# Patient Record
Sex: Male | Born: 1985 | Race: Black or African American | Hispanic: No | Marital: Single | Smoking: Never smoker
Health system: Southern US, Community
[De-identification: ages and names within clinical notes are randomized; demographics above are authoritative.]

## PROBLEM LIST (undated history)

## (undated) HISTORY — PX: HAND SURGERY: SHX662

---

## 2007-11-07 ENCOUNTER — Emergency Department (HOSPITAL_COMMUNITY): Admission: EM | Admit: 2007-11-07 | Discharge: 2007-11-07 | Payer: Self-pay | Admitting: Emergency Medicine

## 2008-03-05 ENCOUNTER — Emergency Department (HOSPITAL_COMMUNITY): Admission: EM | Admit: 2008-03-05 | Discharge: 2008-03-06 | Payer: Self-pay | Admitting: Emergency Medicine

## 2008-09-12 ENCOUNTER — Emergency Department (HOSPITAL_COMMUNITY): Admission: EM | Admit: 2008-09-12 | Discharge: 2008-09-12 | Payer: Self-pay | Admitting: Emergency Medicine

## 2009-07-26 ENCOUNTER — Emergency Department (HOSPITAL_COMMUNITY): Admission: EM | Admit: 2009-07-26 | Discharge: 2009-07-26 | Payer: Self-pay | Admitting: Emergency Medicine

## 2010-07-30 ENCOUNTER — Emergency Department (HOSPITAL_COMMUNITY)
Admission: EM | Admit: 2010-07-30 | Discharge: 2010-07-30 | Disposition: A | Discharge status: Home / Self Care | Payer: BC Managed Care – PPO | Attending: Emergency Medicine | Admitting: Emergency Medicine

## 2010-07-30 DIAGNOSIS — R21 Rash and other nonspecific skin eruption: Secondary | ICD-10-CM | POA: Insufficient documentation

## 2013-02-23 ENCOUNTER — Emergency Department (HOSPITAL_COMMUNITY): Payer: BC Managed Care – PPO

## 2013-02-23 ENCOUNTER — Encounter (HOSPITAL_COMMUNITY): Payer: Self-pay | Admitting: Emergency Medicine

## 2013-02-23 ENCOUNTER — Emergency Department (HOSPITAL_COMMUNITY)
Admission: EM | Admit: 2013-02-23 | Discharge: 2013-02-23 | Disposition: A | Discharge status: Home / Self Care | Payer: BC Managed Care – PPO | Attending: Emergency Medicine | Admitting: Emergency Medicine

## 2013-02-23 DIAGNOSIS — B9789 Other viral agents as the cause of diseases classified elsewhere: Secondary | ICD-10-CM | POA: Insufficient documentation

## 2013-02-23 DIAGNOSIS — Z79899 Other long term (current) drug therapy: Secondary | ICD-10-CM | POA: Insufficient documentation

## 2013-02-23 DIAGNOSIS — B349 Viral infection, unspecified: Secondary | ICD-10-CM

## 2013-02-23 MED ORDER — ACETAMINOPHEN 325 MG PO TABS
650.0000 mg | ORAL_TABLET | Freq: Once | ORAL | Status: AC
  Administered 2013-02-23: 650 mg via ORAL
  Filled 2013-02-23: qty 2

## 2013-02-23 NOTE — ED Provider Notes (Signed)
CSN: [id number]     Arrival date & time 02/23/13  1121 History   First MD Initiated Contact with Patient 02/23/13 1127     Chief Complaint  Patient presents with  . Sore Throat  . Chills   (Consider location/radiation/quality/duration/timing/severity/associated sxs/prior Treatment) The history is provided by the patient. No language interpreter was used.  Jacob Robertson is a 27 y/o M presenting to the ED with cough, nasal congestion, sore throat, generalized bodyaches, headache that started yesterday. Patient reported that his sore throat is a soreness sensation that worsens with swallowing and coughing. Patient reported that he has been coughing up some phlegm, described the phlegm as a yellowish discoloration. Patient reported that he has aches all over his body, reported intermittent chills over the night last night. Stated that his symptoms started at approximately 11:30 PM last night. Reported that he has been using Tylenol cold and flu with minimal relief. Denied flu vaccination. Patient works for [employer] in [place]. Denied visual distortions, nausea, vomiting, diarrhea, abdominal pain, rash, recent travels outside the [place], chest pain, shortness of breath, difficulty breathing, ear pain, neck pain, neck stiffness, difficulty swallowing. PCP [facility] in [place]  History reviewed. No pertinent past medical history. History reviewed. No pertinent past surgical history. History reviewed. No pertinent family history. History  Substance Use Topics  . Smoking status: Never Smoker   . Smokeless tobacco: Not on file  . Alcohol Use: Not on file    Review of Systems  Constitutional: Positive for fever (Low-grade) and chills.  HENT: Positive for congestion, rhinorrhea and sore throat. Negative for ear pain and trouble swallowing.   Eyes: Negative for visual disturbance.  Respiratory: Positive for cough. Negative for chest tightness and shortness of breath.    Cardiovascular: Negative for chest pain.  Gastrointestinal: Negative for nausea, vomiting, abdominal pain and diarrhea.  Musculoskeletal: Positive for myalgias. Negative for neck pain and neck stiffness.  Neurological: Positive for headaches. Negative for weakness and numbness.  All other systems reviewed and are negative.    Allergies  Review of patient's allergies indicates no known allergies.  Home Medications   Current Outpatient Rx  Name  Route  Sig  Dispense  Refill  . cyclobenzaprine (FLEXERIL) 10 MG tablet   Oral   Take 10 mg by mouth 3 (three) times daily as needed for muscle spasms.         . Phenylephrine-DM-GG-APAP (TYLENOL COLD/FLU SEVERE) 5-10-200-325 MG TABS   Oral   Take 1 tablet by mouth every 6 (six) hours as needed (cold).          BP 142/75  Pulse 87  Temp(Src) 99.6 F (37.6 C)  Resp 18  SpO2 100% Physical Exam  Nursing note and vitals reviewed. Constitutional: He is oriented to person, place, and time. He appears well-developed and well-nourished. No distress.  HENT:  Head: Normocephalic and atraumatic.  Mouth/Throat: Oropharynx is clear and moist. No oropharyngeal exudate.  Negative posterior oropharynx swelling. Mild erythema noted to the oropharynx posteriorly. Uvula midline, symmetrical elevation. Negative tonsillar exudate or swelling identified bilaterally. Negative trismus. Negative signs of peritonsillar abscess. Negative oral lesions  Eyes: Conjunctivae and EOM are normal. Pupils are equal, round, and reactive to light. Right eye exhibits no discharge. Left eye exhibits no discharge.  Neck: Normal range of motion. Neck supple.  Negative neck stiffness Negative nuchal rigidity Negative cervical lymphadenopathy Negative meningeal signs  Cardiovascular: Normal rate and regular rhythm.  Exam reveals no friction rub.  No murmur heard. Pulses:      Radial pulses are 2+ on the right side, and 2+ on the left side.       Dorsalis pedis pulses  are 2+ on the right side, and 2+ on the left side.  Pulmonary/Chest: Effort normal and breath sounds normal. No respiratory distress. He has no wheezes. He has no rales.  Musculoskeletal: Normal range of motion.  Lymphadenopathy:    He has no cervical adenopathy.  Neurological: He is alert and oriented to person, place, and time. He exhibits normal muscle tone. Coordination normal.  Skin: Skin is warm and dry. He is not diaphoretic. No erythema.  Psychiatric: He has a normal mood and affect. His behavior is normal. Thought content normal.    ED Course  Procedures (including critical care time) Labs Review Labs Reviewed  RAPID STREP SCREEN  CULTURE, GROUP A STREP   Imaging Review Dg Chest 2 View  02/23/2013   CLINICAL DATA:  Cough and fever  EXAM: CHEST  2 VIEW  COMPARISON:  None.  FINDINGS: The lungs are clear. Heart size and pulmonary vascularity are normal. No adenopathy. No bone lesions.  IMPRESSION: No abnormality noted.   Electronically Signed   By: Bretta Bang M.D.   On: 02/23/2013 12:18    EKG Interpretation   None       MDM   1. Viral illness     Patient presenting to emergency department with cough, congestion, headache, myalgias generalized that started yesterday at approximately 11:30 AM. Patient works at [employer] and a [employer]. Alert and oriented. Negative neck stiffness, negative meningeal signs. Lungs clear to auscultation bilaterally. Heart rate and rhythm normal. Full range of motion to upper and lower extremities bilaterally. Negative signs of rashes. Negative signs of oral lesions. Oral exam unremarkable -negative lesions noted. Chest x-ray negative for any acute abnormalities. Rapid strep test negative. Doubt pneumonia. Suspicion to be possible viral illness. Patient stable, mild low-grade fever identified in the ED with 99.38F-Tylenol given while in ED setting. Discharged patient. Discussed with patient viral illness and supportive therapy.  Discussed with patient to rest and stay hydrated. Recommended flu vaccine. Recommended Tylenol as needed for fever control. Discussed with patient to closely monitor symptoms and if symptoms are to worsen or change report back to emergency department - strict return instructions given. Patient agreed to plan of care, understood, all questions answered.    Raymon Mutton, PA-C 02/23/13 2351

## 2013-02-23 NOTE — ED Notes (Signed)
Pt c/o of sore throat, chills, fever x2 days. Denies n/v/d.

## 2013-02-24 NOTE — ED Provider Notes (Signed)
Medical screening examination/treatment/procedure(s) were performed by non-physician practitioner and as supervising physician I was immediately available for consultation/collaboration.  Parsa Rickett, MD 02/24/13 0651 

## 2013-02-25 LAB — CULTURE, GROUP A STREP

## 2015-10-14 ENCOUNTER — Ambulatory Visit (HOSPITAL_COMMUNITY)
Admission: EM | Admit: 2015-10-14 | Discharge: 2015-10-14 | Disposition: A | Discharge status: Home / Self Care | Payer: BLUE CROSS/BLUE SHIELD | Attending: Emergency Medicine | Admitting: Emergency Medicine

## 2015-10-14 ENCOUNTER — Encounter (HOSPITAL_COMMUNITY): Payer: Self-pay | Admitting: Emergency Medicine

## 2015-10-14 DIAGNOSIS — B350 Tinea barbae and tinea capitis: Secondary | ICD-10-CM | POA: Diagnosis not present

## 2015-10-14 MED ORDER — KETOCONAZOLE 2 % EX SHAM: 1.0000 "application " | MEDICATED_SHAMPOO | CUTANEOUS | Status: AC

## 2015-10-14 NOTE — ED Notes (Signed)
[facility] a [facility] of a rash on his head that he believed to be a ringworm. The patient stated that it has been there about 3 weeks.

## 2015-10-14 NOTE — ED Provider Notes (Signed)
CSN: [id number]     Arrival date & time 10/14/15  1744 History   First MD Initiated Contact with Patient 10/14/15 1831     Chief Complaint  Patient presents with  . Rash   (Consider location/radiation/quality/duration/timing/severity/associated sxs/prior Treatment) HPI Comments: 30 year old male noticed a small annular bulging spots to his scalp approximately 3 weeks ago. They have been occurring in various areas and sizes since then. Mildly pruritic. There are some smaller areas that seem to be healing in hair is growing back.   History reviewed. No pertinent past medical history. History reviewed. No pertinent past surgical history. Family History  Problem Relation Age of Onset  . Osteoarthritis Mother    Social History  Substance Use Topics  . Smoking status: Never Smoker   . Smokeless tobacco: None  . Alcohol Use: No    Review of Systems  Constitutional: Negative.   Skin: Positive for rash.       To scalp  All other systems reviewed and are negative.   Allergies  Review of patient's allergies indicates no known allergies.  Home Medications   Prior to Admission medications   Medication Sig Start Date End Date Taking? Authorizing Provider  cyclobenzaprine (FLEXERIL) 10 MG tablet Take 10 mg by mouth 3 (three) times daily as needed for muscle spasms.    Historical Provider, MD  ketoconazole (NIZORAL) 2 % shampoo Apply 1 application topically 2 (two) times a week. To scalp 10/14/15 10/21/15  Hayden Rasmussenavid Santos Hardwick, NP  Phenylephrine-DM-GG-APAP (TYLENOL COLD/FLU SEVERE) 5-10-200-325 MG TABS Take 1 tablet by mouth every 6 (six) hours as needed (cold).    Historical Provider, MD   Meds Ordered and Administered this Visit  Medications - No data to display  BP 122/72 mmHg  Pulse 82  Temp(Src) 98.4 F (36.9 C) (Oral)  Resp 16  SpO2 98% No data found.   Physical Exam  Constitutional: He is oriented to person, place, and time. He appears well-developed and well-nourished. No distress.   Eyes: EOM are normal.  Neck: Normal range of motion. Neck supple.  Cardiovascular: Normal rate.   Pulmonary/Chest: Effort normal. No respiratory distress.  Musculoskeletal: He exhibits no edema.  Neurological: He is alert and oriented to person, place, and time. He exhibits normal muscle tone.  Skin: Skin is warm and dry.  Annular areas of alopecia in various sizes to the scalp. Minor pruritus. Not much scaling. No signs of infection.  Psychiatric: He has a normal mood and affect.  Nursing note and vitals reviewed.   ED Course  Procedures (including critical care time)  Labs Review Labs Reviewed - No data to display  Imaging Review No results found.   Visual Acuity Review  Right Eye Distance:   Left Eye Distance:   Bilateral Distance:    Right Eye Near:   Left Eye Near:    Bilateral Near:         MDM   1. Tinea capitis    Meds ordered this encounter  Medications  . ketoconazole (NIZORAL) 2 % shampoo    Sig: Apply 1 application topically 2 (two) times a week. To scalp    Dispense:  120 mL    Refill:  0    Order Specific Question:  Supervising Provider    Answer:  Charm RingsHONIG, ERIN J [[id number]]   use the Nizoral shampoo as directed During the day use the Lamisil cream twice daily. Keep head dry.    Hayden Rasmussenavid Lota Leamer, NP 10/14/15 [phone]

## 2019-02-03 ENCOUNTER — Ambulatory Visit (INDEPENDENT_AMBULATORY_CARE_PROVIDER_SITE_OTHER): Payer: Worker's Compensation

## 2019-02-03 ENCOUNTER — Other Ambulatory Visit: Payer: Self-pay

## 2019-02-03 ENCOUNTER — Ambulatory Visit (HOSPITAL_COMMUNITY)
Admission: EM | Admit: 2019-02-03 | Discharge: 2019-02-03 | Disposition: A | Discharge status: Home / Self Care | Payer: Worker's Compensation | Attending: Urgent Care | Admitting: Urgent Care

## 2019-02-03 ENCOUNTER — Encounter (HOSPITAL_COMMUNITY): Payer: Self-pay

## 2019-02-03 ENCOUNTER — Ambulatory Visit (HOSPITAL_COMMUNITY): Payer: BC Managed Care – PPO

## 2019-02-03 DIAGNOSIS — M7989 Other specified soft tissue disorders: Secondary | ICD-10-CM

## 2019-02-03 DIAGNOSIS — M79642 Pain in left hand: Secondary | ICD-10-CM | POA: Diagnosis not present

## 2019-02-03 DIAGNOSIS — S62647A Nondisplaced fracture of proximal phalanx of left little finger, initial encounter for closed fracture: Secondary | ICD-10-CM | POA: Diagnosis not present

## 2019-02-03 DIAGNOSIS — S6992XA Unspecified injury of left wrist, hand and finger(s), initial encounter: Secondary | ICD-10-CM

## 2019-02-03 DIAGNOSIS — S61412A Laceration without foreign body of left hand, initial encounter: Secondary | ICD-10-CM | POA: Diagnosis not present

## 2019-02-03 DIAGNOSIS — Z23 Encounter for immunization: Secondary | ICD-10-CM

## 2019-02-03 DIAGNOSIS — W268XXA Contact with other sharp object(s), not elsewhere classified, initial encounter: Secondary | ICD-10-CM

## 2019-02-03 MED ORDER — TETANUS-DIPHTH-ACELL PERTUSSIS 5-2.5-18.5 LF-MCG/0.5 IM SUSP
0.5000 mL | Freq: Once | INTRAMUSCULAR | Status: AC
  Administered 2019-02-03: 0.5 mL via INTRAMUSCULAR

## 2019-02-03 MED ORDER — LIDOCAINE-EPINEPHRINE (PF) 2 %-1:200000 IJ SOLN
INTRAMUSCULAR | Status: AC
  Filled 2019-02-03: qty 20

## 2019-02-03 MED ORDER — TETANUS-DIPHTH-ACELL PERTUSSIS 5-2.5-18.5 LF-MCG/0.5 IM SUSP
INTRAMUSCULAR | Status: AC
  Filled 2019-02-03: qty 0.5

## 2019-02-03 MED ORDER — NAPROXEN 500 MG PO TABS: 500.0000 mg | ORAL_TABLET | Freq: Two times a day (BID) | ORAL | 0 refills | Status: DC

## 2019-02-03 MED ORDER — HYDROCODONE-ACETAMINOPHEN 5-325 MG PO TABS: 2.0000 | ORAL_TABLET | ORAL | 0 refills | Status: DC | PRN

## 2019-02-03 MED ORDER — CEPHALEXIN 500 MG PO CAPS: 500.0000 mg | ORAL_CAPSULE | Freq: Three times a day (TID) | ORAL | 0 refills | Status: DC

## 2019-02-03 NOTE — ED Notes (Signed)
Ortho tech applied splint .    Patient had avs in treatment room-reviewed avs and work note with patient.   Escorted patient to the door.

## 2019-02-03 NOTE — Discharge Instructions (Addendum)
Please start take naproxen twice daily for your pain and inflammation.  If this does not help control your hand pain then adding hydrocodone.  Depending on how severe your pain is you can take 1 to 2 tablets every 4 hours as you needed but please do not take this medication if your pain is controlled with naproxen.  Contact one of the hand surgeons I provided for you on your visit summary first thing Monday morning.

## 2019-02-03 NOTE — ED Triage Notes (Signed)
Pt has a gash in his left hand. This injury took place at work last night.

## 2019-02-03 NOTE — Progress Notes (Signed)
Orthopedic Tech Progress Note Patient Details:  Jacob Robertson 06-29-1985 [id number] Patient had a bucky coban dressing on hand, removed an put a 2X2 and 2 pieces of tape to area on hand. (did not want ace wrap plus coban on patient may become to tight with time). Patient fingers were extremely sore so I applied splint on while fingers were slightly bent. Ortho Devices Type of Ortho Device: Ulna gutter splint Ortho Device/Splint Location: ULE Ortho Device/Splint Interventions: Adjustment, Application, Ordered   Post Interventions Patient Tolerated: Well Instructions Provided: Care of device, Adjustment of device   Janit Pagan 02/03/2019, 3:58 PM

## 2019-02-03 NOTE — ED Provider Notes (Signed)
MRN: [id number] DOB: 12-21-1985  Subjective:   Jacob Robertson is a 33 y.o. male presenting for worker's comp visit. Reports suffering a left hand injury last night while at work.  States that a metal piece crushed and impaled his left hand.  He suffered a laceration from that injury and immediately wash his wound.  He covered his hand with a thick dressing.  He presents to our urgent care today for evaluation.  Cannot recall his last Tdap.  Baran's medications list, allergies, past medical history and past surgical history were reviewed and excluded from this note due to being a worker's comp case.   Objective:   Vitals: BP 135/82 (BP Location: Left Arm)   Pulse 78   Temp 98.5 F (36.9 C) (Oral)   Resp 18   SpO2 100%   Physical Exam Constitutional:      Appearance: Normal appearance. He is well-developed and normal weight.  HENT:     Head: Normocephalic and atraumatic.     Right Ear: External ear normal.     Left Ear: External ear normal.     Nose: Nose normal.     Mouth/Throat:     Pharynx: Oropharynx is clear.  Eyes:     Extraocular Movements: Extraocular movements intact.     Pupils: Pupils are equal, round, and reactive to light.  Cardiovascular:     Rate and Rhythm: Normal rate.  Pulmonary:     Effort: Pulmonary effort is normal.  Musculoskeletal:     Left hand: He exhibits decreased range of motion (cannot flex at MCP, PIP of 4th, 5th fingers), tenderness, bony tenderness and swelling (over area circled). He exhibits normal capillary refill and no deformity. Lacerations: over palmar surface of his hand. Normal sensation noted. Decreased strength (secondary to pain) noted.       Hands:  Neurological:     Mental Status: He is alert and oriented to person, place, and time.  Psychiatric:        Mood and Affect: Mood normal.        Behavior: Behavior normal.     Dg Hand Complete Left  Result Date: 02/03/2019 CLINICAL DATA:  Per pt: works at [employer], got left hand cut in  between a metal beam last night. Exam is post suturing to the left hand, bandage in place. Laceration is mid to distal, AP 4th metacarpal. No prior injury to the left hand. Patient is no.*comment was truncated*left hand crush injury EXAM: LEFT HAND - COMPLETE 3+ VIEW COMPARISON:  None. FINDINGS: Horizontal fracture of the fifth digit, proximal phalanx. Transverse fracture spans the proximal metadiaphysis. Fracture does not enter the articular surface. Lateral projection demonstrates mild dorsal angulation of the distal fracture fragment IMPRESSION: Horizontal fracture through the shaft of the proximal phalanx fifth digit. Electronically Signed   By: Genevive Bi M.D.   On: 02/03/2019 15:22   PROCEDURE NOTE: laceration repair Verbal consent obtained from patient.  Local anesthesia with 3cc Lidocaine 2% with epinephrine.  Wound explored for tendon, ligament damage. Wound scrubbed with soap and water and rinsed. Wound closed with #3 4-0 Ethilon (2 simple interrupted, 1 horizontal mattress) sutures.  Wound cleansed and dressed.   Assessment and Plan :   1. Closed nondisplaced fracture of proximal phalanx of left little finger, initial encounter   2. Injury of left hand, initial encounter   3. Laceration of left hand without foreign body, initial encounter   4. Left hand pain   5. Swelling of left hand  Patient suffered a 5th proximal phalanx fracture, due to the laceration will consider open fracture.  Patient is to start Keflex, laceration successfully repaired.  Patient placed in ulnar gutter splint.  Will have him follow-up with hand surgeon ASAP.  Patient help from work until cleared from Copy.  He is to schedule naproxen for pain and inflammation, use hydrocodone for breakthrough pain.  [place] database reviewed and patient is low risk. Counseled patient on potential for adverse effects with medications prescribed/recommended today, ER and return-to-clinic precautions discussed, patient  verbalized understanding.    Jaynee Eagles, PA-C 02/03/19 1626

## 2019-03-24 ENCOUNTER — Other Ambulatory Visit: Payer: Self-pay | Admitting: *Deleted

## 2019-03-24 DIAGNOSIS — Z20828 Contact with and (suspected) exposure to other viral communicable diseases: Secondary | ICD-10-CM

## 2019-03-24 DIAGNOSIS — Z20822 Contact with and (suspected) exposure to covid-19: Secondary | ICD-10-CM

## 2019-03-27 LAB — NOVEL CORONAVIRUS, NAA: SARS-CoV-2, NAA: NOT DETECTED

## 2019-09-24 ENCOUNTER — Ambulatory Visit: Payer: BC Managed Care – PPO | Attending: Internal Medicine

## 2019-09-24 DIAGNOSIS — Z23 Encounter for immunization: Secondary | ICD-10-CM

## 2019-09-24 NOTE — Progress Notes (Signed)
   Covid-19 Vaccination Clinic  Name:  Semaje Kinker    MRN: [id number] DOB: 1985-07-26  09/24/2019  Mr. Trueheart was observed post Covid-19 immunization for 15 minutes without incident. He was provided with Vaccine Information Sheet and instruction to access the V-Safe system.   Mr. Seidman was instructed to call 911 with any severe reactions post vaccine: Marland Kitchen Difficulty breathing  . Swelling of face and throat  . A fast heartbeat  . A bad rash all over body  . Dizziness and weakness   Immunizations Administered    Name Date Dose VIS Date Route   Pfizer COVID-19 Vaccine 09/24/2019 12:01 PM 0.3 mL 07/04/2018 Intramuscular   Manufacturer: [employer], [employer]   Lot: [id number]   NDC: [address]-2

## 2019-10-15 ENCOUNTER — Ambulatory Visit: Payer: BC Managed Care – PPO | Attending: Internal Medicine

## 2019-10-15 DIAGNOSIS — Z23 Encounter for immunization: Secondary | ICD-10-CM

## 2019-10-15 NOTE — Progress Notes (Signed)
   Covid-19 Vaccination Clinic  Name:  Jacob Robertson    MRN: [id number] DOB: 24-Sep-1985  10/15/2019  [name] was observed post Covid-19 immunization for 15 minutes without incident. He was provided with Vaccine Information Sheet and instruction to access the V-Safe system.   [name] was instructed to call 911 with any severe reactions post vaccine: Marland Kitchen Difficulty breathing  . Swelling of face and throat  . A fast heartbeat  . A bad rash all over body  . Dizziness and weakness   Immunizations Administered    Name Date Dose VIS Date Route   Pfizer COVID-19 Vaccine 10/15/2019 12:42 PM 0.3 mL 07/04/2018 Intramuscular   Manufacturer: [employer], [employer]   Lot: [id number]   NDC: [address]-2

## 2020-11-21 ENCOUNTER — Ambulatory Visit: Payer: BC Managed Care – PPO | Attending: Internal Medicine

## 2020-11-21 DIAGNOSIS — Z20822 Contact with and (suspected) exposure to covid-19: Secondary | ICD-10-CM

## 2020-11-22 LAB — NOVEL CORONAVIRUS, NAA: SARS-CoV-2, NAA: DETECTED — AB

## 2020-11-22 LAB — SARS-COV-2, NAA 2 DAY TAT

## 2021-02-07 IMAGING — DX DG HAND COMPLETE 3+V*L*
3 series · 3 of 3 positions shown · non-contrast
Comparison: None.

CLINICAL DATA: Per pt: works at [employer], got left hand cut in between a
metal beam last night. Exam is post suturing to the left hand,
bandage in place. Laceration is mid to distal, AP 4th metacarpal. No
prior injury to the left hand. Patient is no.*comment was
truncated*left hand crush injury

EXAM:
LEFT HAND - COMPLETE 3+ VIEW

[hand pa]
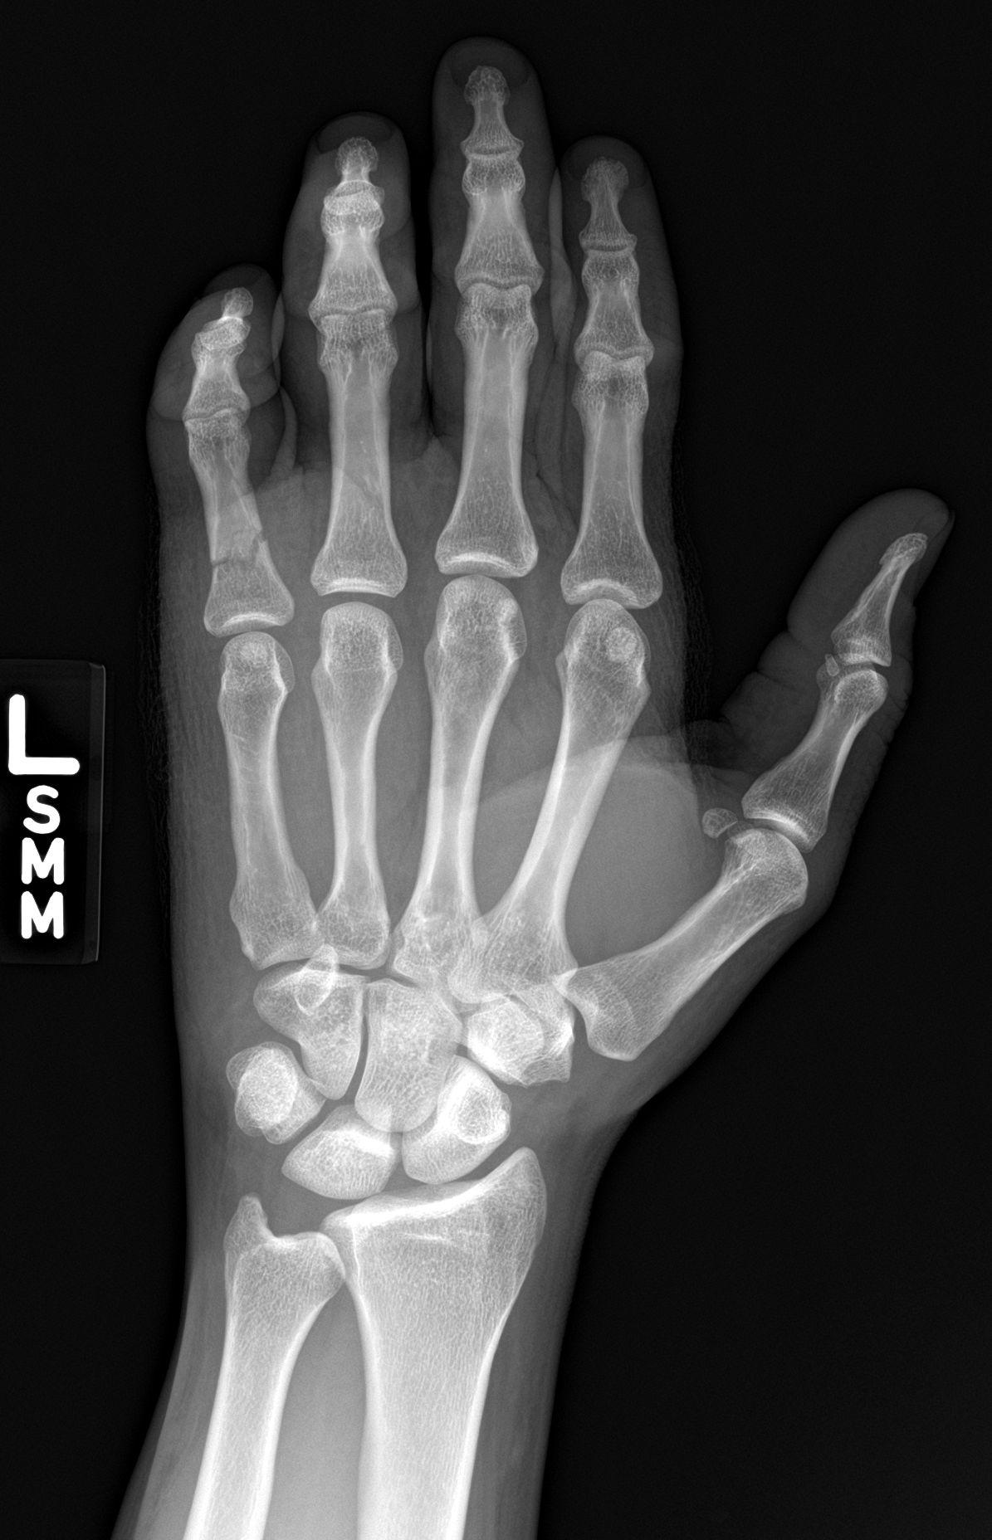

[hand obl]
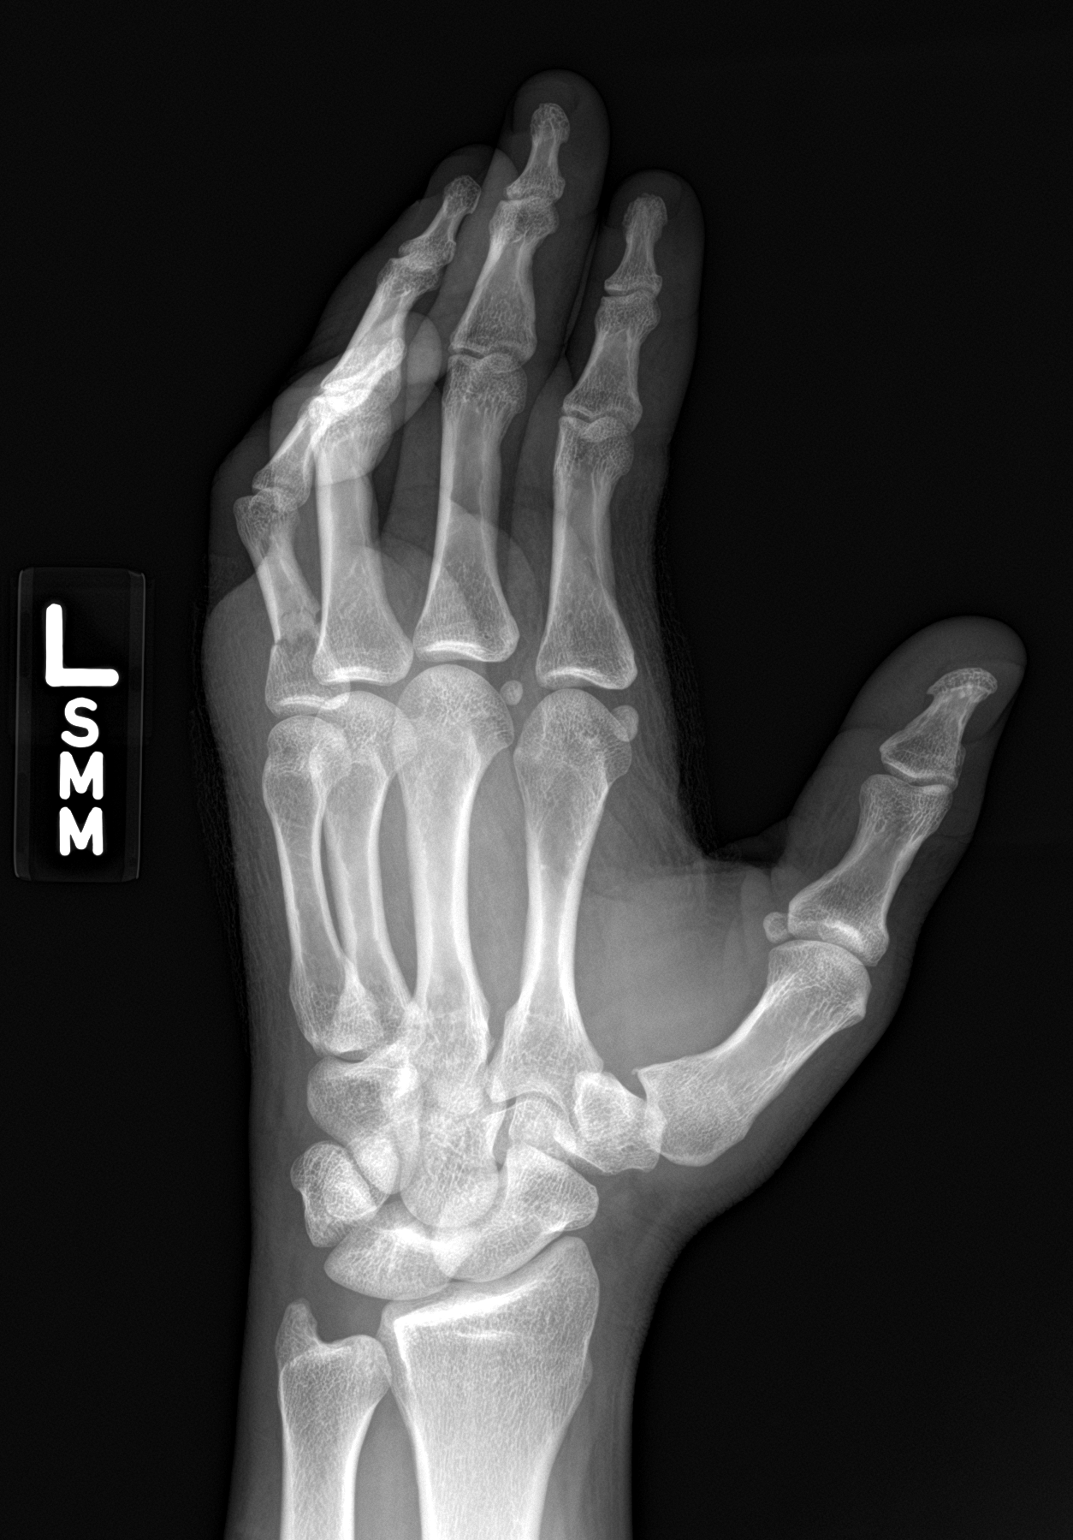

[hand lat]
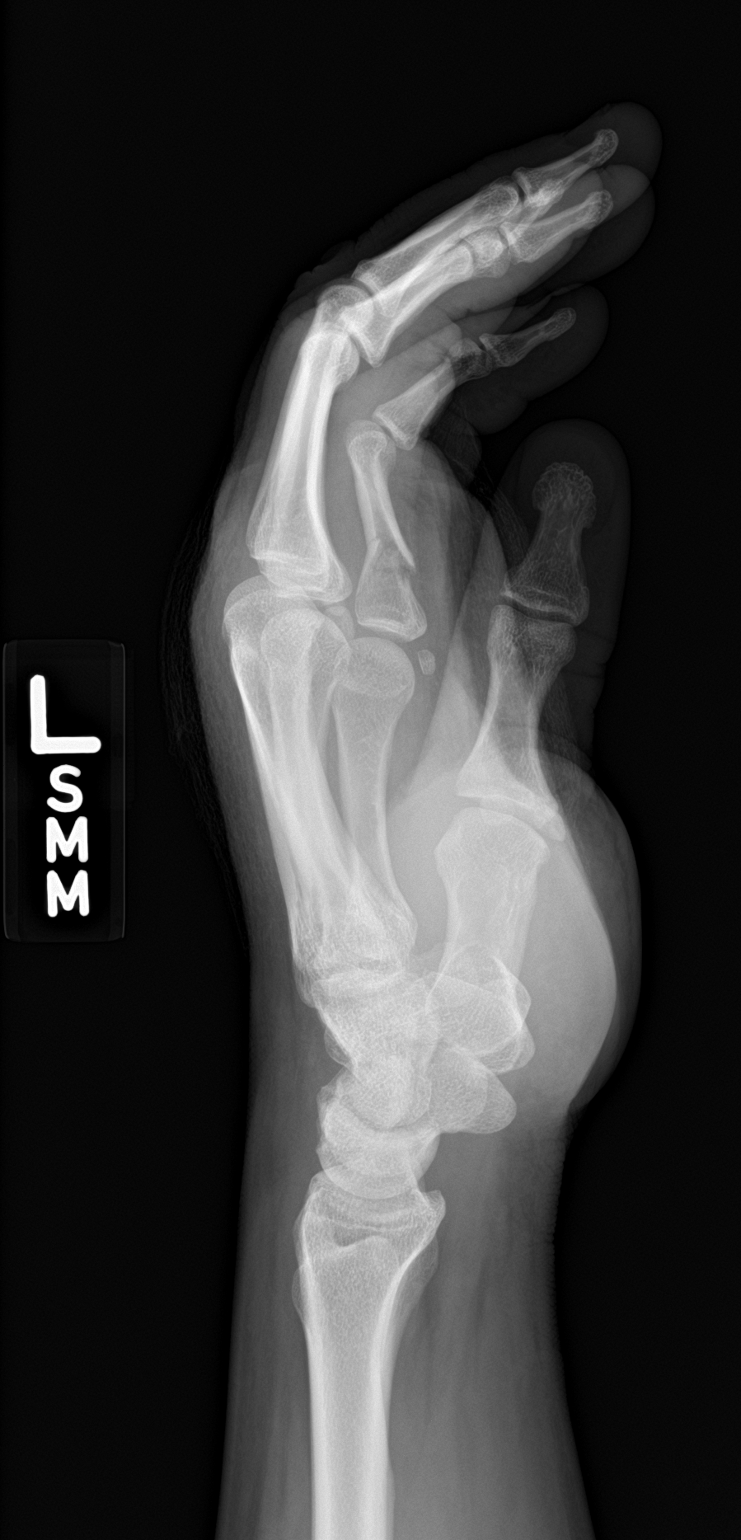

[3 of 3 positions shown; findings below may reference images not displayed]

FINDINGS: Horizontal fracture of the fifth digit, proximal phalanx. Transverse
fracture spans the proximal metadiaphysis. Fracture does not enter
the articular surface. Lateral projection demonstrates mild dorsal
angulation of the distal fracture fragment
IMPRESSION: Horizontal fracture through the shaft of the proximal phalanx fifth
digit.

## 2021-05-12 ENCOUNTER — Ambulatory Visit (HOSPITAL_COMMUNITY)
Admission: EM | Admit: 2021-05-12 | Discharge: 2021-05-12 | Disposition: A | Discharge status: Home / Self Care | Payer: BC Managed Care – PPO | Attending: Urgent Care | Admitting: Urgent Care

## 2021-05-12 ENCOUNTER — Other Ambulatory Visit: Payer: Self-pay

## 2021-05-12 ENCOUNTER — Encounter (HOSPITAL_COMMUNITY): Payer: Self-pay

## 2021-05-12 DIAGNOSIS — T23162A Burn of first degree of back of left hand, initial encounter: Secondary | ICD-10-CM

## 2021-05-12 MED ORDER — IBUPROFEN 600 MG PO TABS: 600.0000 mg | ORAL_TABLET | Freq: Four times a day (QID) | ORAL | 0 refills | Status: DC | PRN

## 2021-05-12 MED ORDER — SILVER SULFADIAZINE 1 % EX CREA: 1.0000 "application " | TOPICAL_CREAM | Freq: Every day | CUTANEOUS | 0 refills | Status: DC

## 2021-05-12 NOTE — ED Provider Notes (Signed)
[facility]    CSN: [id number] Arrival date & time: 05/12/21  1937      History   Chief Complaint Chief Complaint  Patient presents with   Hand Burn    Left hand     HPI Jacob Robertson is a 36 y.o. male.   Pleasant 36 year old male presents today with concerns regarding a superficial burn to his left wrist that occurred around 6 PM this evening while trying to cook food and oil.  He states the oil splattered onto his wrist.  He immediately rinsed it off under cool water.  He stated it is slightly uncomfortable to pressure.  He denies any blistering but reports there is an area that looks like it is "trying to blister".  He has not tried any treatments over-the-counter    History reviewed. No pertinent past medical history.  There are no problems to display for this patient.   Past Surgical History:  Procedure Laterality Date   HAND SURGERY Left        Home Medications    Prior to Admission medications   Medication Sig Start Date End Date Taking? Authorizing Provider  ibuprofen (ADVIL) 600 MG tablet Take 1 tablet (600 mg total) by mouth every 6 (six) hours as needed. 05/12/21  Yes Rosene Pilling L, PA  silver sulfADIAZINE (SILVADENE) 1 % cream Apply 1 application topically daily. 05/12/21  Yes Janie Capp, Jodelle Gross, PA    Family History Family History  Problem Relation Age of Onset   Osteoarthritis Mother     Social History Social History   Tobacco Use   Smoking status: Never   Smokeless tobacco: Never  Vaping Use   Vaping Use: Never used  Substance Use Topics   Alcohol use: No   Drug use: Never     Allergies   Patient has no known allergies.   Review of Systems Review of Systems  Skin:  Positive for color change (1st degree burn L wrist).    Physical Exam Triage Vital Signs ED Triage Vitals  Enc Vitals Group     BP 05/12/21 2038 140/89     Pulse Rate 05/12/21 2038 94     Resp 05/12/21 2038 16     Temp 05/12/21 2038 98.7 F (37.1 C)      Temp Source 05/12/21 2038 Oral     SpO2 05/12/21 2038 99 %     Weight --      Height --      Head Circumference --      Peak Flow --      Pain Score 05/12/21 2037 4     Pain Loc --      Pain Edu? --      Excl. in GC? --    No data found.  Updated Vital Signs BP 140/89 (BP Location: Right Arm)    Pulse 94    Temp 98.7 F (37.1 C) (Oral)    Resp 16    SpO2 99%   Visual Acuity Right Eye Distance:   Left Eye Distance:   Bilateral Distance:    Right Eye Near:   Left Eye Near:    Bilateral Near:     Physical Exam Vitals and nursing note reviewed.  Constitutional:      General: He is not in acute distress.    Appearance: He is well-developed.  HENT:     Head: Normocephalic and atraumatic.  Eyes:     Conjunctiva/sclera: Conjunctivae normal.  Cardiovascular:  Rate and Rhythm: Normal rate and regular rhythm.     Heart sounds: No murmur heard. Pulmonary:     Effort: Pulmonary effort is normal. No respiratory distress.     Breath sounds: Normal breath sounds.  Abdominal:     Palpations: Abdomen is soft.     Tenderness: There is no abdominal tenderness.  Musculoskeletal:        General: No swelling. Normal range of motion.     Cervical back: Neck supple.  Skin:    General: Skin is warm and dry.     Capillary Refill: Capillary refill takes less than 2 seconds.     Findings: Rash (1st degree burn to L dorsal wrist, no blistering) present.  Neurological:     Mental Status: He is alert.  Psychiatric:        Mood and Affect: Mood normal.     UC Treatments / Results  Labs (all labs ordered are listed, but only abnormal results are displayed) Labs Reviewed - No data to display  EKG   Radiology No results found.  Procedures Wound Care  Date/Time: 05/12/2021 9:48 PM Performed by: Chaney Malling, PA Authorized by: Chaney Malling, PA   Consent:    Consent obtained:  Verbal   Consent given by:  Patient   Alternatives discussed:  No treatment Procedure  details:    Wound location:  Hand   Hand location:  L wrist   Wound age (days):  <1 Comments:     Silvadene burn cream applied, topped with telfa pad and coban wrap (including critical care time)  Medications Ordered in UC Medications - No data to display  Initial Impression / Assessment and Plan / UC Course  I have reviewed the triage vital signs and the nursing notes.  Pertinent labs & imaging results that were available during my care of the patient were reviewed by me and considered in my medical decision making (see chart for details).     First degree burn of wrist - topical silvadene cream x 5 days, or until resolved. Ibuprofen as needed for pain/swelling  Final Clinical Impressions(s) / UC Diagnoses   Final diagnoses:  First degree burn of back of hand, left, initial encounter     Discharge Instructions      You were treated with silvadene in office today. Please continue the burn cream 1 applications daily x 5 days, or until resolved Take Ibuprofen as needed for pain/swelling If blisters develop, please leave them alone and do not pop them    ED Prescriptions     Medication Sig Dispense Auth. Provider   silver sulfADIAZINE (SILVADENE) 1 % cream Apply 1 application topically daily. 50 g Jahan Friedlander L, PA   ibuprofen (ADVIL) 600 MG tablet Take 1 tablet (600 mg total) by mouth every 6 (six) hours as needed. 20 tablet Sena Clouatre L, [place]      PDMP not reviewed this encounter.   Chaney Malling, [place] 05/12/21 2149

## 2021-05-12 NOTE — Discharge Instructions (Addendum)
You were treated with silvadene in office today. Please continue the burn cream 1 applications daily x 5 days, or until resolved Take Ibuprofen as needed for pain/swelling If blisters develop, please leave them alone and do not pop them

## 2021-05-12 NOTE — ED Triage Notes (Signed)
Patient presents to Urgent Care with complaints of left hand burn from oil landing on his his hand and forearm area. He applied antibacterial to site.

## 2022-10-25 ENCOUNTER — Ambulatory Visit (HOSPITAL_COMMUNITY)
Admission: EM | Admit: 2022-10-25 | Discharge: 2022-10-25 | Disposition: A | Discharge status: Home / Self Care | Payer: BC Managed Care – PPO | Attending: Emergency Medicine | Admitting: Emergency Medicine

## 2022-10-25 ENCOUNTER — Encounter (HOSPITAL_COMMUNITY): Payer: Self-pay

## 2022-10-25 DIAGNOSIS — H1031 Unspecified acute conjunctivitis, right eye: Secondary | ICD-10-CM

## 2022-10-25 MED ORDER — CIPROFLOXACIN HCL 0.3 % OP SOLN: OPHTHALMIC | 0 refills | Status: DC

## 2022-10-25 NOTE — ED Triage Notes (Signed)
Patient here today with c/o right eye redness for 4-5 days. He wears contacts and feels like there is something scratching his eye even with the contact out. He has some sensitivity to light. Mild headache. Using Opcon-A with some temporarily relief.

## 2022-10-25 NOTE — Discharge Instructions (Addendum)
Based on my physical exam findings and the symptoms that you described to me today, believe that you are experiencing bacterial conjunctivitis.  Because your contact lens wearer, you do have a higher risk of developing bacterial conjunctivitis as well as complications from bacterial conjunctivitis which include preseptal cellulitis and orbital cellulitis.  Orbital cellulitis is a site threatening condition.  While you are being treated for bacterial conjunctivitis, you cannot wear your contact lenses at all.  Doing so will prevent this infection from resolving and increase your risk of developing the above complications.  I have sent a prescription for antibiotic eyedrop to your pharmacy called ciprofloxacin ophthalmic solution.  You will place 1 drop in the affected eye every 2 hours while you are awake.  You will do this for 2 days.  For the next 5 days, you will instill 1 drop into your affected eye every 4 hours while you are awake to complete a full 7-day course of treatment.  If your eye pain worsens, your vision worsens or you develop swelling of your upper or lower eyelid while you are using ciprofloxacin eyedrops, please go to the emergency room for further evaluation.  Thank you for visiting [facility] today.

## 2022-10-25 NOTE — ED Provider Notes (Signed)
[facility]    CSN: [id number] Arrival date & time: 10/25/22  1716    HISTORY   Chief Complaint  Patient presents with   Eye Problem    Right eye has been red for a few days. I do wear contacts and time to time feels like something is scratching my eye even when contacts are taken out . Right eye is sensitive to light. This problems gives a mild headache.  Using Opcon-A - Entered by patient   HPI Jacob Robertson is a pleasant, 37 y.o. male who presents to urgent care today. Patient complains of redness in his right eye for the past few days.  Patient states he wears contacts and sometimes feels like something is scratching his eye even when he takes his contacts out.  Patient states he is wearing his contacts at this time because he is unable to see without them.  Patient states that he has noticed that when light shined in his right eye it causes a dull ache like a headache and causes his eyes to tear.  Patient states he has been using Opcon-A eyedrops without meaningful relief.  Patient denies trauma to his right eye or exposure to bacterial infection.  The history is provided by the patient.   History reviewed. No pertinent past medical history. There are no problems to display for this patient.  Past Surgical History:  Procedure Laterality Date   HAND SURGERY Left     Home Medications    Prior to Admission medications   Medication Sig Start Date End Date Taking? Authorizing Provider  ciprofloxacin (CILOXAN) 0.3 % ophthalmic solution Administer 1 drop, every 2 hours, while awake, for 2 days. Then 1 drop, every 4 hours, while awake, for the next 5 days. 10/25/22  Yes Theadora Rama Scales, PA-C    Family History Family History  Problem Relation Age of Onset   Osteoarthritis Mother    Social History Social History   Tobacco Use   Smoking status: Never   Smokeless tobacco: Never  Vaping Use   Vaping Use: Never used  Substance Use Topics   Alcohol use: No    Drug use: Never   Allergies   Patient has no known allergies.  Review of Systems Review of Systems Pertinent findings revealed after performing a 14 point review of systems has been noted in the history of present illness.  Physical Exam Vital Signs BP 139/82 (BP Location: Left Arm)   Pulse 89   Temp 98.2 F (36.8 C) (Oral)   Resp 16   Ht 5\' 9"  (1.753 m)   Wt 225 lb (102.1 kg)   SpO2 97%   BMI 33.23 kg/m   No data found.  Physical Exam Vitals and nursing note reviewed.  Constitutional:      General: He is not in acute distress.    Appearance: Normal appearance. He is not ill-appearing.  HENT:     Head: Normocephalic and atraumatic.     Salivary Glands: Right salivary gland is not diffusely enlarged or tender. Left salivary gland is not diffusely enlarged or tender.     Right Ear: Tympanic membrane, ear canal and external ear normal. No drainage. No middle ear effusion. There is no impacted cerumen. Tympanic membrane is not erythematous or bulging.     Left Ear: Tympanic membrane, ear canal and external ear normal. No drainage.  No middle ear effusion. There is no impacted cerumen. Tympanic membrane is not erythematous or bulging.     Nose:  Nose normal. No nasal deformity, septal deviation, mucosal edema, congestion or rhinorrhea.     Right Turbinates: Not enlarged, swollen or pale.     Left Turbinates: Not enlarged, swollen or pale.     Right Sinus: No maxillary sinus tenderness or frontal sinus tenderness.     Left Sinus: No maxillary sinus tenderness or frontal sinus tenderness.     Mouth/Throat:     Lips: Pink. No lesions.     Mouth: Mucous membranes are moist. No oral lesions.     Pharynx: Oropharynx is clear. Uvula midline. No posterior oropharyngeal erythema or uvula swelling.     Tonsils: No tonsillar exudate. 0 on the right. 0 on the left.  Eyes:     General: Lids are normal. Lids are everted, no foreign bodies appreciated. Vision grossly intact. Gaze aligned  appropriately. No allergic shiner, visual field deficit or scleral icterus.       Right eye: Discharge present. No foreign body or hordeolum.        Left eye: No foreign body, discharge or hordeolum.     Extraocular Movements: Extraocular movements intact.     Conjunctiva/sclera:     Right eye: Right conjunctiva is injected. Exudate present. No chemosis or hemorrhage.    Left eye: Left conjunctiva is not injected. No chemosis, exudate or hemorrhage. Neck:     Trachea: Trachea and phonation normal.  Cardiovascular:     Rate and Rhythm: Normal rate and regular rhythm.     Pulses: Normal pulses.     Heart sounds: Normal heart sounds. No murmur heard.    No friction rub. No gallop.  Pulmonary:     Effort: Pulmonary effort is normal. No accessory muscle usage, prolonged expiration or respiratory distress.     Breath sounds: Normal breath sounds. No stridor, decreased air movement or transmitted upper airway sounds. No decreased breath sounds, wheezing, rhonchi or rales.  Chest:     Chest wall: No tenderness.  Musculoskeletal:        General: Normal range of motion.     Cervical back: Normal range of motion and neck supple. Normal range of motion.  Lymphadenopathy:     Cervical: No cervical adenopathy.  Skin:    General: Skin is warm and dry.     Findings: No erythema or rash.  Neurological:     General: No focal deficit present.     Mental Status: He is alert and oriented to person, place, and time.  Psychiatric:        Mood and Affect: Mood normal.        Behavior: Behavior normal.     Visual Acuity Right Eye Distance:   Left Eye Distance:   Bilateral Distance:    Right Eye Near:   Left Eye Near:    Bilateral Near:     UC Couse / Diagnostics / Procedures:     Radiology No results found.  Procedures Procedures (including critical care time) EKG  Pending results:  Labs Reviewed - No data to display  Medications Ordered in UC: Medications - No data to display  UC  Diagnoses / Final Clinical Impressions(s)   I have reviewed the triage vital signs and the nursing notes.  Pertinent labs & imaging results that were available during my care of the patient were reviewed by me and considered in my medical decision making (see chart for details).    Final diagnoses:  Acute bacterial conjunctivitis of right eye   Patient advised to remove contacts  for the duration of treatment.  Patient provided with ciprofloxacin eyedrops to use as prescribed.  Patient advised to follow-up with ophthalmologist if no better in the next few days.  Patient advised to go the emergency room if he develops frank eye pain.  Please see discharge instructions below for details of plan of care as provided to patient. ED Prescriptions     Medication Sig Dispense Auth. Provider   ciprofloxacin (CILOXAN) 0.3 % ophthalmic solution Administer 1 drop, every 2 hours, while awake, for 2 days. Then 1 drop, every 4 hours, while awake, for the next 5 days. 5 mL Theadora Rama Scales, PA-C      PDMP not reviewed this encounter.  Pending results:  Labs Reviewed - No data to display  Discharge Instructions:   Discharge Instructions      Based on my physical exam findings and the symptoms that you described to me today, believe that you are experiencing bacterial conjunctivitis.  Because your contact lens wearer, you do have a higher risk of developing bacterial conjunctivitis as well as complications from bacterial conjunctivitis which include preseptal cellulitis and orbital cellulitis.  Orbital cellulitis is a site threatening condition.  While you are being treated for bacterial conjunctivitis, you cannot wear your contact lenses at all.  Doing so will prevent this infection from resolving and increase your risk of developing the above complications.  I have sent a prescription for antibiotic eyedrop to your pharmacy called ciprofloxacin ophthalmic solution.  You will place 1 drop in  the affected eye every 2 hours while you are awake.  You will do this for 2 days.  For the next 5 days, you will instill 1 drop into your affected eye every 4 hours while you are awake to complete a full 7-day course of treatment.  If your eye pain worsens, your vision worsens or you develop swelling of your upper or lower eyelid while you are using ciprofloxacin eyedrops, please go to the emergency room for further evaluation.  Thank you for visiting [facility] today.    Disposition Upon Discharge:  Condition: stable for discharge home  Patient presented with an acute illness with associated systemic symptoms and significant discomfort requiring urgent management. In my opinion, this is a condition that a prudent lay person (someone who possesses an average knowledge of health and medicine) may potentially expect to result in complications if not addressed urgently such as respiratory distress, impairment of bodily function or dysfunction of bodily organs.   Routine symptom specific, illness specific and/or disease specific instructions were discussed with the patient and/or caregiver at length.   As such, the patient has been evaluated and assessed, work-up was performed and treatment was provided in alignment with urgent care protocols and evidence based medicine.  Patient/parent/caregiver has been advised that the patient may require follow up for further testing and treatment if the symptoms continue in spite of treatment, as clinically indicated and appropriate.  Patient/parent/caregiver has been advised to return to the [facility] or PCP if no better; to PCP or the Emergency Department if new signs and symptoms develop, or if the current signs or symptoms continue to change or worsen for further workup, evaluation and treatment as clinically indicated and appropriate  The patient will follow up with their current PCP if and as advised. If the patient does not currently have a PCP we  will assist them in obtaining one.   The patient may need specialty follow up if the symptoms continue,  in spite of conservative treatment and management, for further workup, evaluation, consultation and treatment as clinically indicated and appropriate.  Patient/parent/caregiver verbalized understanding and agreement of plan as discussed.  All questions were addressed during visit.  Please see discharge instructions below for further details of plan.  This office note has been dictated using Teaching laboratory technician.  Unfortunately, this method of dictation can sometimes lead to typographical or grammatical errors.  I apologize for your inconvenience in advance if this occurs.  Please do not hesitate to reach out to me if clarification is needed.      Theadora Rama Scales, [place] 10/26/22 [id number]

## 2022-11-05 ENCOUNTER — Encounter (HOSPITAL_COMMUNITY): Payer: Self-pay

## 2022-11-05 ENCOUNTER — Ambulatory Visit (HOSPITAL_COMMUNITY)
Admission: EM | Admit: 2022-11-05 | Discharge: 2022-11-05 | Disposition: A | Discharge status: Home / Self Care | Payer: BC Managed Care – PPO | Attending: Physician Assistant | Admitting: Physician Assistant

## 2022-11-05 DIAGNOSIS — S39012A Strain of muscle, fascia and tendon of lower back, initial encounter: Secondary | ICD-10-CM | POA: Diagnosis not present

## 2022-11-05 MED ORDER — IBUPROFEN 800 MG PO TABS: 800.0000 mg | ORAL_TABLET | Freq: Three times a day (TID) | ORAL | 0 refills | Status: AC

## 2022-11-05 MED ORDER — METHOCARBAMOL 500 MG PO TABS: 500.0000 mg | ORAL_TABLET | Freq: Two times a day (BID) | ORAL | 0 refills | Status: DC
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
## 2022-11-05 NOTE — Discharge Instructions (Signed)
I believe that you have injured some of the muscles in your back.  Start ibuprofen 3 times daily.  Do not take NSAIDs with this medication including aspirin, ibuprofen/Advil, naproxen/Aleve.  You can use Tylenol as needed.  Take Robaxin up to twice a day.  This will make you sleepy so do not drive or drink alcohol with taking it.  Use heat, rest, stretch for additional symptom relief.  Follow-up with sports medicine if your symptoms or not improving quickly.  If anything worsens you need to be seen immediately.
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
## 2022-12-10 ENCOUNTER — Ambulatory Visit (HOSPITAL_COMMUNITY)
Admission: EM | Admit: 2022-12-10 | Discharge: 2022-12-10 | Disposition: A | Discharge status: Home / Self Care | Payer: BC Managed Care – PPO | Attending: Emergency Medicine | Admitting: Emergency Medicine

## 2022-12-10 ENCOUNTER — Encounter (HOSPITAL_COMMUNITY): Payer: Self-pay | Admitting: Emergency Medicine

## 2022-12-10 ENCOUNTER — Other Ambulatory Visit: Payer: Self-pay

## 2022-12-10 DIAGNOSIS — H1132 Conjunctival hemorrhage, left eye: Secondary | ICD-10-CM | POA: Diagnosis not present

## 2022-12-10 DIAGNOSIS — S39012S Strain of muscle, fascia and tendon of lower back, sequela: Secondary | ICD-10-CM

## 2022-12-10 MED ORDER — METHOCARBAMOL 500 MG PO TABS: 500.0000 mg | ORAL_TABLET | Freq: Two times a day (BID) | ORAL | 0 refills | Status: AC

## 2022-12-10 NOTE — Discharge Instructions (Addendum)
Same instructions for methocarbamol as before - don't take when you have to work or drive as it can make you sleepy, and don't drink alcohol with it. Ok to take only at night.   Please follow up with sports medicine. Either clinic listed below is fine.
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
## 2023-06-07 ENCOUNTER — Ambulatory Visit (HOSPITAL_COMMUNITY)
Admission: EM | Admit: 2023-06-07 | Discharge: 2023-06-07 | Disposition: A | Discharge status: Home / Self Care | Payer: BC Managed Care – PPO

## 2023-06-07 ENCOUNTER — Encounter (HOSPITAL_COMMUNITY): Payer: Self-pay

## 2023-06-07 DIAGNOSIS — A09 Infectious gastroenteritis and colitis, unspecified: Secondary | ICD-10-CM | POA: Diagnosis not present

## 2023-06-07 NOTE — ED Provider Notes (Signed)
[facility]    CSN: [id number] Arrival date & time: 06/07/23  1053      History   Chief Complaint Chief Complaint  Patient presents with   Diarrhea    HPI Jacob Robertson is a 38 y.o. male.   HPI  Patient presents with one day history of diarrhea. There are several members of household with similar symptoms He has been taking Pepto at home to assist with symptoms  He reports he is predominantly having diarrhea but did feel nauseous last night He has taken a Pepto chewable He is trying to stay hydrated with gatorade and gatorade light        History reviewed. No pertinent past medical history.  There are no active problems to display for this patient.   Past Surgical History:  Procedure Laterality Date   HAND SURGERY Left        Home Medications    Prior to Admission medications   Medication Sig Start Date End Date Taking? Authorizing Provider  ibuprofen (ADVIL) 800 MG tablet Take 1 tablet (800 mg total) by mouth 3 (three) times daily. 11/05/22   Raspet, Noberto Retort, PA-C  methocarbamol (ROBAXIN) 500 MG tablet Take 1 tablet (500 mg total) by mouth 2 (two) times daily. 12/10/22   Cathlyn Parsons, NP    Family History Family History  Problem Relation Age of Onset   Osteoarthritis Mother    Multiple sclerosis Mother     Social History Social History   Tobacco Use   Smoking status: Never   Smokeless tobacco: Never  Vaping Use   Vaping status: Never Used  Substance Use Topics   Alcohol use: No   Drug use: Never     Allergies   Patient has no known allergies.   Review of Systems Review of Systems  Constitutional:  Negative for chills and fever.  Gastrointestinal:  Positive for diarrhea and nausea. Negative for abdominal pain and vomiting.  Neurological:  Negative for dizziness and headaches.     Physical Exam Triage Vital Signs ED Triage Vitals  Encounter Vitals Group     BP 06/07/23 1208 125/79     Systolic BP Percentile --       Diastolic BP Percentile --      Pulse Rate 06/07/23 1208 75     Resp 06/07/23 1208 16     Temp 06/07/23 1208 98.3 F (36.8 C)     Temp Source 06/07/23 1208 Oral     SpO2 06/07/23 1208 97 %     Weight --      Height --      Head Circumference --      Peak Flow --      Pain Score 06/07/23 1207 0     Pain Loc --      Pain Education --      Exclude from Growth Chart --    No data found.  Updated Vital Signs BP 125/79 (BP Location: Left Arm)   Pulse 75   Temp 98.3 F (36.8 C) (Oral)   Resp 16   SpO2 97%   Visual Acuity Right Eye Distance:   Left Eye Distance:   Bilateral Distance:    Right Eye Near:   Left Eye Near:    Bilateral Near:     Physical Exam Vitals reviewed.  Constitutional:      General: He is awake.     Appearance: Normal appearance. He is well-developed and well-groomed.  HENT:  Head: Normocephalic and atraumatic.  Eyes:     Extraocular Movements: Extraocular movements intact.     Conjunctiva/sclera: Conjunctivae normal.  Pulmonary:     Effort: Pulmonary effort is normal.  Musculoskeletal:     Cervical back: Normal range of motion.  Neurological:     General: No focal deficit present.     Mental Status: He is alert and oriented to person, place, and time.     GCS: GCS eye subscore is 4. GCS verbal subscore is 5. GCS motor subscore is 6.  Psychiatric:        Attention and Perception: Attention normal.        Mood and Affect: Mood normal.        Speech: Speech normal.        Behavior: Behavior normal. Behavior is cooperative.      UC Treatments / Results  Labs (all labs ordered are listed, but only abnormal results are displayed) Labs Reviewed - No data to display  EKG   Radiology No results found.  Procedures Procedures (including critical care time)  Medications Ordered in UC Medications - No data to display  Initial Impression / Assessment and Plan / UC Course  I have reviewed the triage vital signs and the nursing  notes.  Pertinent labs & imaging results that were available during my care of the patient were reviewed by me and considered in my medical decision making (see chart for details).      Final Clinical Impressions(s) / UC Diagnoses   Final diagnoses:  Diarrhea of infectious origin   Acute, new concern With confirmed cases of similar symptoms in household, I suspect viral gastroenteritis at this time Reviewed conservative, symptomatic management with him. Recommend Pepto and fluid replenishment as tolerated. Can incorporate small portions of bland diet as tolerated Reviewed that he can use half doses of Imodium if necessary to complete day to day but it is preferred to allow this to run course.  Reviewed ED and return precautions. Follow up as needed for persistent or progressing symptoms     Discharge Instructions      For your symptoms you can take Pepto to help with GI upset and diarrhea. If needed I recommend only using half doses of Imodium to control diarrhea symptoms Please make sure you are staying well hydrated with plenty of water and electrolyte replacement drinks Try to incorporate small portions of a bland diet until you are feeling better. You can gradually reintroduce your normal diet as tolerated. If you start to have severe symptoms, nausea and vomiting to the point you are feeling dehydrated, weak, choking, or trouble breathing, please seek prompt care      ED Prescriptions   None    PDMP not reviewed this encounter.   Providence Crosby, PA-C 06/07/23 1326

## 2023-06-07 NOTE — Discharge Instructions (Addendum)
For your symptoms you can take Pepto to help with GI upset and diarrhea. If needed I recommend only using half doses of Imodium to control diarrhea symptoms Please make sure you are staying well hydrated with plenty of water and electrolyte replacement drinks Try to incorporate small portions of a bland diet until you are feeling better. You can gradually reintroduce your normal diet as tolerated. If you start to have severe symptoms, nausea and vomiting to the point you are feeling dehydrated, weak, choking, or trouble breathing, please seek prompt care

## 2023-06-07 NOTE — ED Triage Notes (Signed)
Pt states diarrhea since yesterday.  States he took pepto at home.
